# Patient Record
Sex: Female | Born: 1945 | Race: White | Hispanic: No | Marital: Single | Smoking: Never smoker
Health system: Southern US, Community
[De-identification: ages and names within clinical notes are randomized; demographics above are authoritative.]

## PROBLEM LIST (undated history)

## (undated) DIAGNOSIS — F32A Depression, unspecified: Secondary | ICD-10-CM

## (undated) DIAGNOSIS — I10 Essential (primary) hypertension: Secondary | ICD-10-CM

## (undated) DIAGNOSIS — E785 Hyperlipidemia, unspecified: Secondary | ICD-10-CM

## (undated) DIAGNOSIS — R7989 Other specified abnormal findings of blood chemistry: Secondary | ICD-10-CM

## (undated) DIAGNOSIS — F419 Anxiety disorder, unspecified: Secondary | ICD-10-CM

## (undated) DIAGNOSIS — N189 Chronic kidney disease, unspecified: Secondary | ICD-10-CM

## (undated) HISTORY — DX: Depression, unspecified: F32.A

## (undated) HISTORY — DX: Chronic kidney disease, unspecified: N18.9

## (undated) HISTORY — PX: TONSILLECTOMY: SUR1361

## (undated) HISTORY — DX: Anxiety disorder, unspecified: F41.9

## (undated) HISTORY — DX: Essential (primary) hypertension: I10

## (undated) HISTORY — DX: Hyperlipidemia, unspecified: E78.5

## (undated) HISTORY — DX: Other specified abnormal findings of blood chemistry: R79.89

## (undated) HISTORY — PX: LAPAROSCOPIC TOTAL HYSTERECTOMY: SUR800

## (undated) HISTORY — PX: CHOLECYSTECTOMY: SHX55

## (undated) NOTE — Progress Notes (Signed)
 Formatting of this note is different from the original. Images from the original note were not included.    Patient Name:  [name]  (MRN: [id number])    DOB: 27-Jun-1946     Primary Care Provider: Dollie Snide, MD   Primary Cardiologist: Belvie Lonni Lever, MD   Primary Electrophysiologist: none  Date of Service: 04/08/2022   Clinic Follow Up   Assessment/Plan:  1. Chest pain  - atypical, seems musculoskeletal.  Likely a consequence of all the coughing - not inclined to pursue ischemic evaluation at this time   2. SOB (shortness of breath)  - probably lingering consequence of COVID   - symptomatic treatment with decongestant, cough suppressant - check echo, proBNP   3. Coronary artery disease involving native coronary artery of native heart without angina pectoris  - no concerning anginal symptoms - reassuring SPECT 2021 - continue aggressive risk factor modification   4. Essential hypertension  - well controlled - continue current medications   5. Mixed hyperlipidemia  - she is not on statin for unclear reasons.  Previously on atorvastatin 10 in the past - will start rosuvastatin  5 mg daily, consider up titrating to 10 if tolerated    Subjective:  Chief Complaint Chief Complaint  Patient presents with  ? New Patient    W/cardiology   History of Present Illness [name] is a 7 y.o. year old female who presents today for acute ED follow-up.  She was last seen in the office by [facility] in 2021, and previously followed by Dr. Malachy  Her cardiac problems include CAD status post prior RCA PCI (her index angina equivalent was extreme shortness of breath, no chest pain).  Other medical problems include rheumatoid arthritis, chronic iron-deficiency anemia/AVMs of the large/small intestines, hypertension, dyslipidemia.   She began feeling bad around September 1st, with shortness of breath and cough.  Tested positive for COVID in the [facility].  They  prescribed ?a COVID medication? which he took at home.  Essentially she has not felt well since with continued cough and congestion and shortness of breath.  She sought care again in an urgent care in the interim, was treated with steroids.  With her continued symptoms, along with some midsternal chest discomfort, she sought care in the new [place] ED a few days ago.  Workup there unremarkable including clear chest x-ray, normal high sensitivity troponin, and unremarkable ECG.   Current symptoms include continued cough, pleuritic type chest pain, and shortness of breath.  Her chest pain is improving slowly.    Allergies Allergies  Allergen Reactions  ? Remicade [Infliximab] Other (See Comments)    LUPUS , sob  ? Chocolate Flavor [Flavoring Agent] Other (See Comments)    constipation  ? Penicillins     RASH, HIVES   Medications Current Outpatient Medications  Medication Sig Dispense Refill  ? amLODIPine  (NORVASC ) 5 MG tablet Take 1 tablet (5 mg total) by mouth daily. 90 tablet 0  ? aspirin (ECOTRIN) 81 MG EC tablet Take 81 mg by mouth daily.    ? metoprolol  succinate (TOPROL -XL) 25 MG 24 hr tablet Take 1 tablet (25 mg total) by mouth daily. 90 tablet 0  ? pantoprazole  (PROTONIX ) 40 MG tablet Take 1 tablet (40 mg total) by mouth daily. 90 tablet 0  ? sertraline  (ZOLOFT ) 50 MG tablet Take 50 mg by mouth daily.    ? benzonatate (TESSALON) 100 MG capsule Take 1 capsule (100 mg total) by mouth 3 (three) times  daily as needed for Cough for up to 10 days. 30 capsule 1  ? rosuvastatin  (CRESTOR ) 5 MG tablet Take 1 tablet (5 mg total) by mouth daily. 90 tablet 3   No current facility-administered medications for this visit.   Past Medical History Past Medical History:  Diagnosis Date  ? Anemia, chronic disease   ? AVM (arteriovenous malformation) of colon    cauterized in 2017 without any issues since  ? Blood transfusion without reported diagnosis   ? CAD (coronary artery disease)   ? Chronic  kidney disease, stage III (moderate) (HCC)   ? Depression    [facility] 2018  ? History of blood transfusion   ? Hyperlipidemia   ? Hypertension   ? Hypothyroidism   ? Obesity, Class III, BMI 40-49.9 (morbid obesity) (HCC)   ? OSA (obstructive sleep apnea)    compliant with CPAP  ? Osteoarthritis   ? Rheumatoid arthritis (HCC)    discontinued meds & rheumatology follow-up after having bad reaction to Remicade years ago  ? TIA (transient ischemic attack) 2019   pt reports garbled speech for about 15 seconds once, no residual effects   Surgical History Past Surgical History:  Procedure Laterality Date  ? CARDIAC CATHETERIZATION  2014   Patent stent, no intervention  ? CHOLECYSTECTOMY    ? COLONOSCOPY  2017   Dr. Doren  ? CORONARY ANGIOPLASTY WITH STENT PLACEMENT  2005   DES to RCA  ? ORIF PROXIMAL HUMERUS FRACTURE Left   ? PORTACATH PLACEMENT Right    Replaced 2017  ? TONSILLECTOMY    ? TOTAL ABDOMINAL HYSTERECTOMY W/ BILATERAL SALPINGOOPHORECTOMY  1980  ? UPPER GASTROINTESTINAL ENDOSCOPY  2017   Dr. Doren  ? VAGINAL PROLAPSE REPAIR  1990   [facility] or [facility]   Family History She family history includes Cancer - Other in her father; Depression in her brother, brother, and mother; Diabetes in her brother and mother; Hypertension in her brother and mother.   Social History  She  reports that she has never smoked. She has never used smokeless tobacco. She reports that she does not drink alcohol and does not use drugs.   Review of Systems General:  negative for fevers, chills, fatigue, night sweats or weight changes  Respiratory: negative for shortness of breath, cough, wheezing or hemoptysis  Cardiovascular:   as in HPI  Gastrointestinal:  negative for nausea, vomiting, heartburn, abdominal pain or melena  Musculoskeletal: negative for joint stiffness, joint swelling, muscle pain or weakness  Neurological: negative for seizures, tremors, sensory changes or weakness    Objective:  Physical Exam BP 118/62   Pulse 66   Ht 5' (1.524 m)   Wt 185 lb (83.9 kg)   SpO2 97%   BMI 36.13 kg/m   General Appearance:  Alert, cooperative, no acute distress  Neck: No lymphadenopathy or JVD  Lungs:   Good respiratory effort, Lungs are clear to auscultation ; sternum ternder to palptation  Heart:  regular rhythm, normal rate with normal s1, s2, no murmur, no rub or gallop   Abdomen:   Normoactive bowel sounds, soft, non-tender, no distention  Extremities: No cyanosis or edema   Pulses: 2+ and symmetric  Skin: Intact, no rashes or lesions  Neurologic: Grossly non-focal, AOx3   Data:  ECG 04/08/2022: NSR ,  LAD, NS T wave changes   Echo 10/2019  There is mild thickening of the aortic valve leaflets.  There is no aortic stenosis.  There is no aortic regurgitation.  The tricuspid valve appears structurally normal.  There is trivial tricuspid regurgitation.  Pulmonary artery systolic pressure estimated from the TR jet is 30 mmHg.  The left ventricle is normal in size and function with no regional wall  motion abnormalities.  The estimated left ventricular ejection fraction is 60-65%.  SPECT 10/2019 1. Normal myocardial perfusion study without evidence of inducible ischemia or prior infarction.  2. Normal left ventricular wall motion and segmental function.  3. The post stress ejection fraction is measured at 71%.  4. Resting hypertension.  5. Since prior study December 2016, there is no significant change.  Return in about 1 year (around 04/09/2023).  An After Visit Summary (AVS) was printed and given to the patient.  MYRTIS Lonni Lever, MD, [facility] (Cardiology) 04/08/2022 10:01 Electronically signed by Belvie JAYSON Lever, MD at 04/08/2022 10:11 AM EDT

---

## 2003-02-16 ENCOUNTER — Encounter: Admission: RE | Admit: 2003-02-16 | Discharge: 2003-02-16 | Payer: Self-pay | Admitting: Obstetrics and Gynecology

## 2003-02-16 ENCOUNTER — Encounter: Payer: Self-pay | Admitting: Obstetrics and Gynecology

## 2003-10-27 ENCOUNTER — Other Ambulatory Visit: Payer: Self-pay

## 2005-11-01 ENCOUNTER — Emergency Department: Payer: Self-pay | Admitting: Emergency Medicine

## 2006-11-02 ENCOUNTER — Emergency Department: Payer: Self-pay | Admitting: Emergency Medicine

## 2008-04-05 ENCOUNTER — Ambulatory Visit: Payer: Self-pay | Admitting: Family Medicine

## 2008-05-13 ENCOUNTER — Ambulatory Visit: Payer: Self-pay | Admitting: Family Medicine

## 2008-07-12 ENCOUNTER — Encounter: Payer: Self-pay | Admitting: Internal Medicine

## 2008-08-08 ENCOUNTER — Encounter: Payer: Self-pay | Admitting: Internal Medicine

## 2008-10-06 ENCOUNTER — Ambulatory Visit: Payer: Self-pay | Admitting: Internal Medicine

## 2008-10-06 ENCOUNTER — Inpatient Hospital Stay (HOSPITAL_COMMUNITY): Admission: EM | Admit: 2008-10-06 | Discharge: 2008-10-08 | Payer: Self-pay | Admitting: Emergency Medicine

## 2008-12-09 ENCOUNTER — Emergency Department (HOSPITAL_COMMUNITY): Admission: EM | Admit: 2008-12-09 | Discharge: 2008-12-09 | Payer: Self-pay | Admitting: Emergency Medicine

## 2009-06-23 IMAGING — CR DG FOREARM 2V*L*
1 series · 2 of 2 positions shown · non-contrast
Comparison: none

REASON FOR EXAM: pain with bending of left arm
COMMENTS:

PROCEDURE:     MDR - MDR FOREARM LEFT  - May 13, 2008 [DATE]
RESULT:     AP and lateral views of the LEFT forearm reveal no acute
fracture. Mineralization of the radius and ulna appears normal. The
overlying soft tissues are normal in appearance as well.

[Series 1: view not recorded · 0.17mm/px · 2 of 2 slices shown]
[im 1/2]
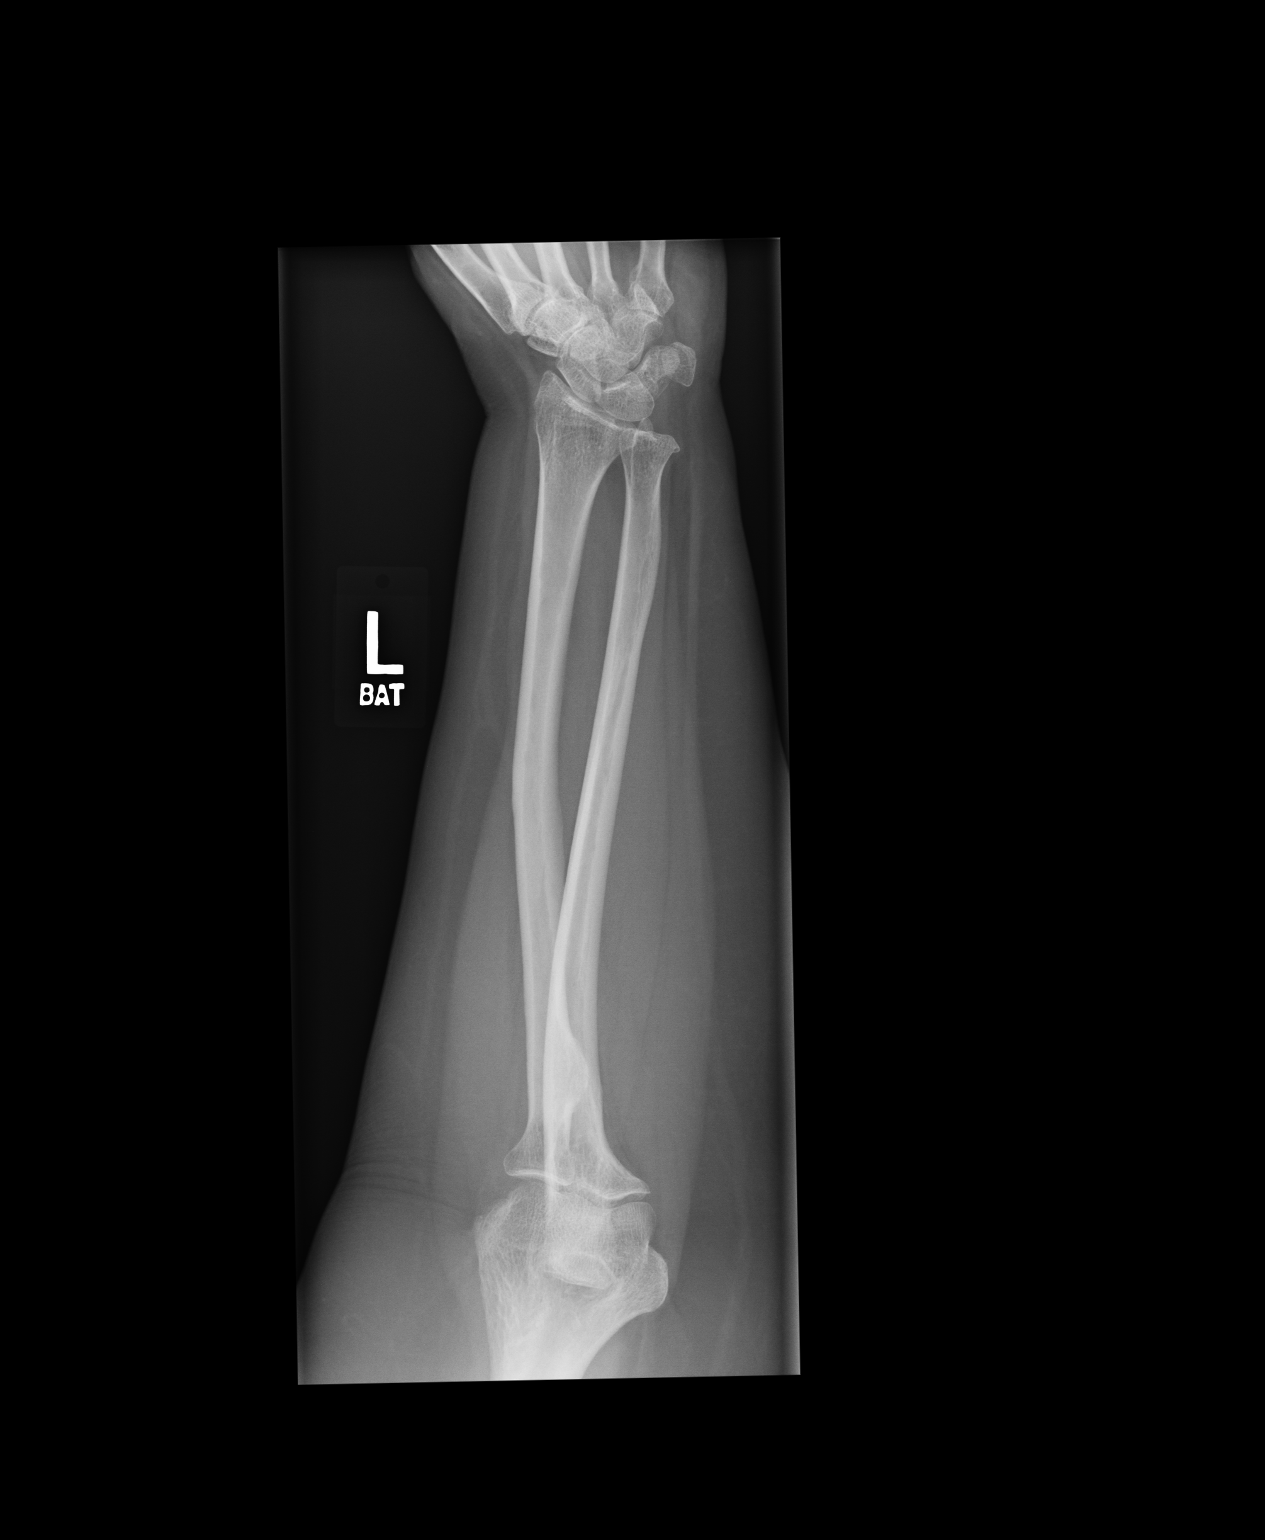
[im 2/2]
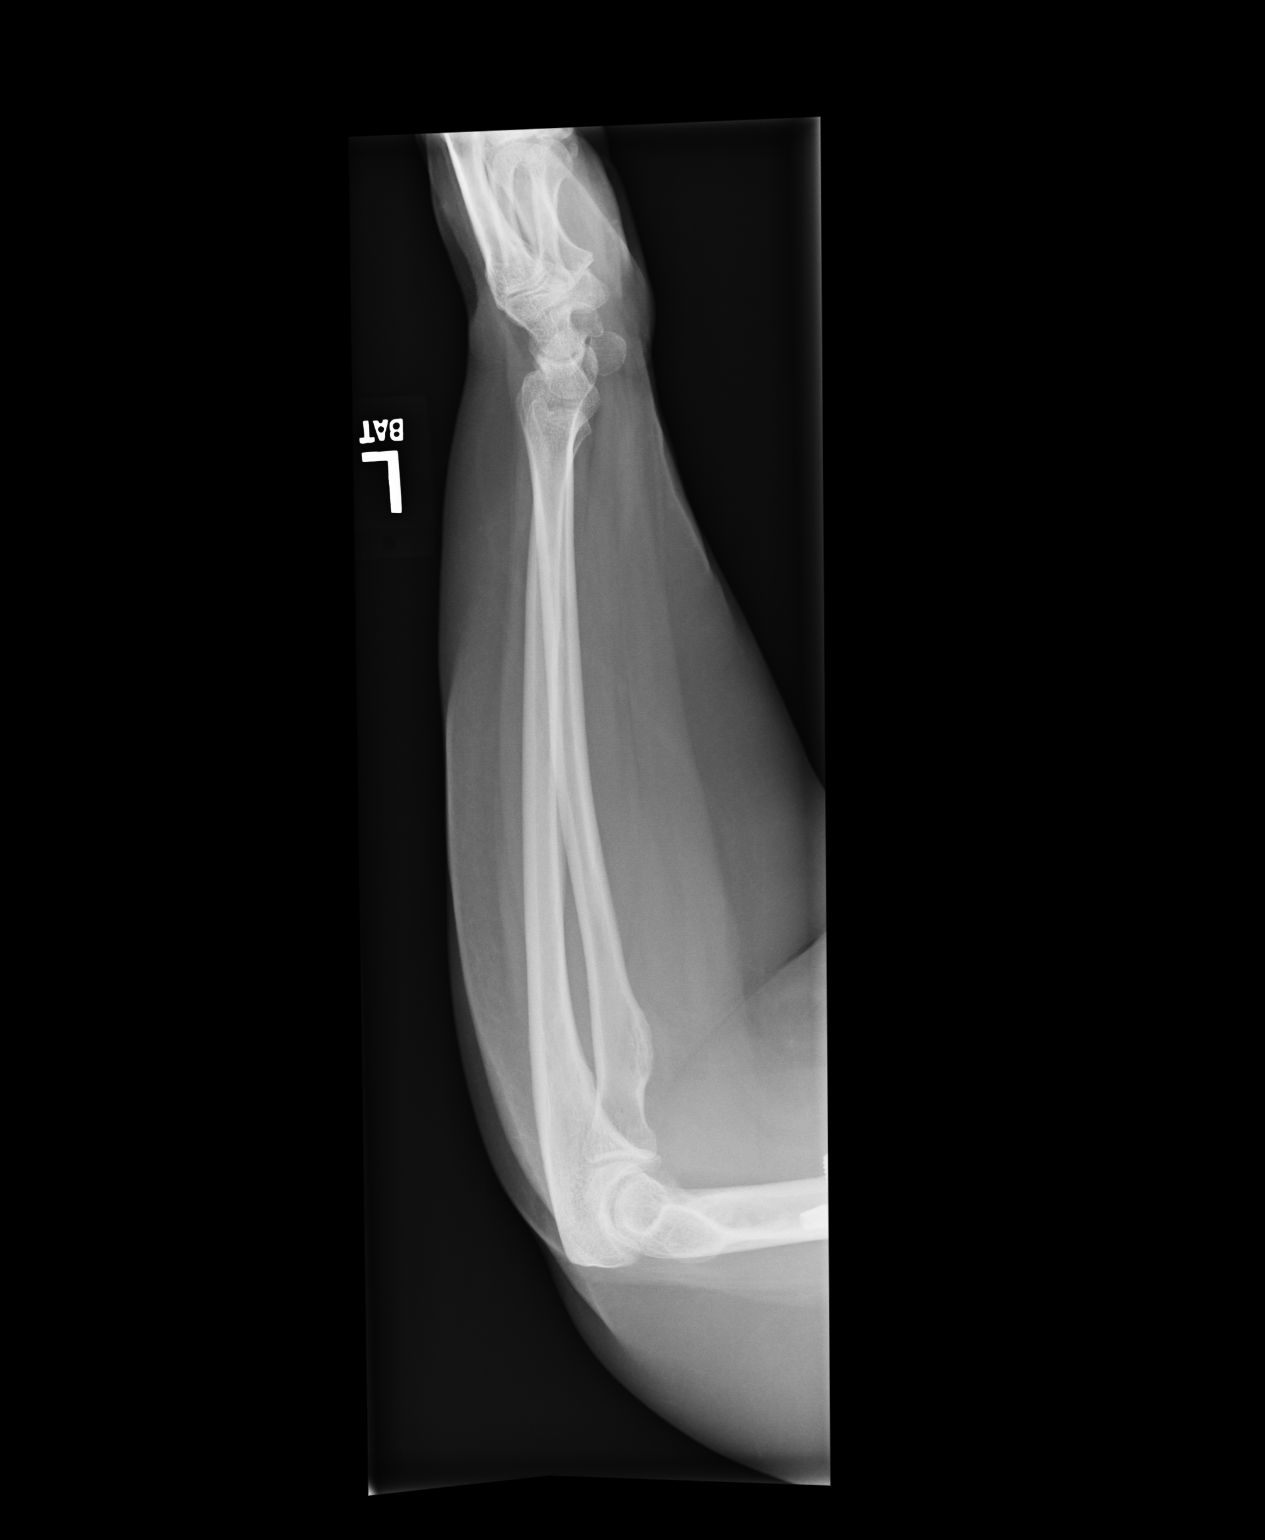

[2 of 2 positions shown; findings below may reference images not displayed]

IMPRESSION: I see no acute bony abnormality of the LEFT forearm.

## 2009-09-01 ENCOUNTER — Encounter: Payer: Self-pay | Admitting: Internal Medicine

## 2009-09-05 ENCOUNTER — Encounter: Payer: Self-pay | Admitting: Internal Medicine

## 2009-10-06 ENCOUNTER — Encounter: Payer: Self-pay | Admitting: Internal Medicine

## 2010-10-15 LAB — COMPREHENSIVE METABOLIC PANEL
ALT: 17 U/L (ref 0–35)
BUN: 23 mg/dL (ref 6–23)
Chloride: 104 mEq/L (ref 96–112)
Creatinine, Ser: 1.36 mg/dL — ABNORMAL HIGH (ref 0.4–1.2)
Glucose, Bld: 119 mg/dL — ABNORMAL HIGH (ref 70–99)
Sodium: 138 mEq/L (ref 135–145)
Total Protein: 6.4 g/dL (ref 6.0–8.3)

## 2010-10-15 LAB — DIFFERENTIAL
Basophils Absolute: 0 10*3/uL (ref 0.0–0.1)
Eosinophils Absolute: 0.1 10*3/uL (ref 0.0–0.7)
Eosinophils Relative: 1 % (ref 0–5)
Monocytes Absolute: 0.6 10*3/uL (ref 0.1–1.0)
Monocytes Relative: 4 % (ref 3–12)
Neutrophils Relative %: 83 % — ABNORMAL HIGH (ref 43–77)

## 2010-10-15 LAB — URINALYSIS, ROUTINE W REFLEX MICROSCOPIC
Ketones, ur: NEGATIVE mg/dL
Specific Gravity, Urine: 1.009 (ref 1.005–1.030)
Urobilinogen, UA: 0.2 mg/dL (ref 0.0–1.0)

## 2010-10-15 LAB — CBC
HCT: 25.1 % — ABNORMAL LOW (ref 36.0–46.0)
MCHC: 32 g/dL (ref 30.0–36.0)
RDW: 16 % — ABNORMAL HIGH (ref 11.5–15.5)

## 2010-10-15 LAB — TYPE AND SCREEN: Antibody Screen: NEGATIVE

## 2010-10-17 LAB — GLUCOSE, CAPILLARY
Glucose-Capillary: 175 mg/dL — ABNORMAL HIGH (ref 70–99)
Glucose-Capillary: 83 mg/dL (ref 70–99)
Glucose-Capillary: 93 mg/dL (ref 70–99)

## 2010-10-17 LAB — CBC
HCT: 25.4 % — ABNORMAL LOW (ref 36.0–46.0)
HCT: 26.3 % — ABNORMAL LOW (ref 36.0–46.0)
Hemoglobin: 8.7 g/dL — ABNORMAL LOW (ref 12.0–15.0)
Hemoglobin: 8.9 g/dL — ABNORMAL LOW (ref 12.0–15.0)
MCHC: 33.5 g/dL (ref 30.0–36.0)
MCV: 88.3 fL (ref 78.0–100.0)
MCV: 89.2 fL (ref 78.0–100.0)
MCV: 89.8 fL (ref 78.0–100.0)
Platelets: 320 10*3/uL (ref 150–400)
Platelets: 330 10*3/uL (ref 150–400)
Platelets: 376 10*3/uL (ref 150–400)
RBC: 2.94 MIL/uL — ABNORMAL LOW (ref 3.87–5.11)
RDW: 19.3 % — ABNORMAL HIGH (ref 11.5–15.5)
WBC: 16.3 10*3/uL — ABNORMAL HIGH (ref 4.0–10.5)
WBC: 9.3 10*3/uL (ref 4.0–10.5)

## 2010-10-17 LAB — FOLATE: Folate: 18.7 ng/mL

## 2010-10-17 LAB — URINALYSIS, ROUTINE W REFLEX MICROSCOPIC
Glucose, UA: NEGATIVE mg/dL
Ketones, ur: NEGATIVE mg/dL
Specific Gravity, Urine: 1.017 (ref 1.005–1.030)
pH: 5.5 (ref 5.0–8.0)

## 2010-10-17 LAB — DIFFERENTIAL
Basophils Absolute: 0 10*3/uL (ref 0.0–0.1)
Basophils Relative: 1 % (ref 0–1)
Eosinophils Absolute: 0.1 10*3/uL (ref 0.0–0.7)
Neutro Abs: 6.3 10*3/uL (ref 1.7–7.7)
Neutrophils Relative %: 72 % (ref 43–77)

## 2010-10-17 LAB — BASIC METABOLIC PANEL
BUN: 12 mg/dL (ref 6–23)
BUN: 14 mg/dL (ref 6–23)
BUN: 17 mg/dL (ref 6–23)
CO2: 24 mEq/L (ref 19–32)
Calcium: 9.6 mg/dL (ref 8.4–10.5)
Chloride: 107 mEq/L (ref 96–112)
Chloride: 108 mEq/L (ref 96–112)
Creatinine, Ser: 0.97 mg/dL (ref 0.4–1.2)
GFR calc Af Amer: 58 mL/min — ABNORMAL LOW (ref >60)
GFR calc Af Amer: >60 mL/min (ref >60)
GFR calc non Af Amer: 48 mL/min — ABNORMAL LOW (ref >60)
GFR calc non Af Amer: 50 mL/min — ABNORMAL LOW (ref >60)
Glucose, Bld: 118 mg/dL — ABNORMAL HIGH (ref 70–99)
Glucose, Bld: 178 mg/dL — ABNORMAL HIGH (ref 70–99)
Potassium: 3.8 mEq/L (ref 3.5–5.1)
Potassium: 4.2 mEq/L (ref 3.5–5.1)
Sodium: 139 mEq/L (ref 135–145)
Sodium: 140 mEq/L (ref 135–145)

## 2010-10-17 LAB — URINE CULTURE: Special Requests: POSITIVE

## 2010-10-17 LAB — RETICULOCYTES: Retic Count, Absolute: 89 10*3/uL (ref 19.0–186.0)

## 2010-10-17 LAB — URINE MICROSCOPIC-ADD ON

## 2010-10-17 LAB — IRON AND TIBC
Saturation Ratios: 15 % — ABNORMAL LOW (ref 20–55)
UIBC: 282 ug/dL

## 2010-10-17 LAB — HEMOGLOBIN A1C
Hgb A1c MFr Bld: 5.6 % (ref 4.6–6.1)
Mean Plasma Glucose: 114 mg/dL

## 2010-11-20 NOTE — H&P (Signed)
[name], [name]              ACCOUNT NO.:  [id number]   MEDICAL RECORD NO.:  [id number]          PATIENT TYPE:  INP   LOCATION:  5125                         FACILITY:  [facility]   PHYSICIAN:  Valerie A. Felicity Coyer, MDDATE OF BIRTH:  [birth year]   DATE OF ADMISSION:  10/06/2008  DATE OF DISCHARGE:                              HISTORY & PHYSICAL   PRIMARY CARE PHYSICIAN:  Dr. Willette Pa, general practitioner at [facility], unassigned to the [facility].   CHIEF COMPLAINT:  Weakness with tremor.   HISTORY OF PRESENT ILLNESS:  A 64 year old white female with multiple  chronic medical issues, who presents to the [facility] ED complaining of  weakness for greater than 24 hours.  She reports onset of these symptoms  after she treated herself last night with Nitrostat to her hemorrhoids  on advise of her surgeon following a visit with her surgeon yesterday  morning.  She has been advised by her primary care physician not to do  so due to adverse side effect of weakness, but the surgeon reported that  this was okay since her hemorrhoids cream was not working, however,  after the treatment, the patient felt so traumatically weak that she  began to shake all over her upper and lower extremities including her  head and neck.  There is no associated incontinence or loss of  consciousness.  No fever or chills.  No syncope or dizzy complaints.  She reports she has also recently on September 29, 2008, decreased her  prednisone from 5 to 4 mg daily in effort to wean off the prednisone due  to coexisting osteoporosis with history of fractures as well as  coexisting type 2 diabetes, but reports this always happens when I go  less than 5 mg per day.  The patient currently denies headache.  No  chest pain.  No shortness of breath.   PAST MEDICAL HISTORY:  Significant for:  1. Type 2 diabetes.  2. Rheumatoid arthritis, on chronic prednisone.  3. History of lupus.  4. Anemia of chronic disease,  status post Hematology eval at both [facility] and [facility] by her report with frequent history of      transfusions, but no specific diagnosis.  5. History of bilateral humerus fractures with skilled nursing      facility in October 2009 at [facility] for short-term rehab.   MEDICATIONS:  At this time, the patient is unsure of her medications  because EMS had the list and it is unavailable to me at this time.  She  does know that she was previously been maintained on prednisone 10 mg  daily and has been increasing this dose by 1 mg each month on the 25th  and has been on 4 mg daily since September 29, 2008.   ALLERGIES:  She has no known drug allergies.   FAMILY HISTORY:  Reviewed and noncontributory to current illness.   SOCIAL HISTORY:  She reports she lives alone, but has a supportive  daughter, who also recently broke her leg.  She does not smoke or  drink.  She is independent with her ADLs and walks independently without  assistance of a cane or walker.   REVIEW OF SYSTEMS:  See HPI above for pertinent positives.  NEUROLOGIC:  Positive for balance problems, positive for shaking all over in the last  24 hours similar to events such as occurred in 2008 when she was  diagnosed with lupus.  No evidence of headaches or mental status  changes.  RESPIRATORY:  No shortness of breath and no cough.  CARDIOVASCULAR:  No chest pain.  No lower extremity swelling changes  bilaterally and no weight changes and no hypoglycemic events.  GI: No  nausea, vomiting, or bowel movement changes.  No abdominal pain.  PSYCH:  Denies anxiety or depression.  MUSCULOSKELETAL:  Positive arthritis  including cramping of her hands and feet, especially at night.   PHYSICAL EXAMINATION:  VITAL SIGNS:  Temperature 99.1, blood pressure  159/55, pulse of 92, respirations 12, and sating 98% on room air.  GENERAL:  She is an obese, but pleasant and alert, older white woman, in  no acute distress.  NEUROLOGIC:   Cranial nerves II through XII are symmetric and intact.  She follows commands.  Her speech is fluent, but has an involuntary and  intermittent tremor throughout her entire body associated with  stuttering speech, alternating with periods of normalcy.  NECK:  Thick, but supple.  No mass.  No thyromegaly.  EYES:  PERRL with no icterus.  No nystagmus.  ENT:  Grossly normal hearing with normal external pinna bilaterally.  RESPIRATORY:  No increased work of breathing or use of accessory  muscles.  LUNGS:  Sounds are clear to auscultation bilaterally without wheeze,  crackle, or rhonchi, but decreased at the bases.  CARDIOVASCULAR:  Regular rate and rhythm with a soft 2/6 systolic  murmur.  There are no rubs or gallops appreciated.  EXTREMITIES:  1+ bilateral edema without pitting.  Good cap refill  bilaterally.  ABDOMEN:  Obese, soft, and nontender with good bowel sounds.  No  appreciable hepatosplenomegaly, hernias, or mass to palpation.  PSYCH:  She is awake, alert, and oriented x3 with a normal mood and  affect.   LABORATORY DATA:  White count normal at 8.8, hemoglobin depressed at  8.8, MCV of 90, and platelets normal at 330.  Basic metabolic; normal  electrolytes and creatinine, noting a glucose of 118.  UA positive for 7-  10 white cells and rare bacteria.  EKG shows sinus tach at 103, but no  ST-T changes.  No old EKG for comparison.   ASSESSMENT AND PLAN:  1. Weakness with failure to thrive and tremors, likely multifactorial      with recent ongoing steroid taper, last decreased approximately 1      week ago, also associated with urinary tract infection and anemia.      See details below.  We will admit to non-tele, increase steroids      for 24 hours for stress dose, and treat other issues as listed      below.  We will check TSH and plan for PT/OT to see while here      regarding rehab recs.  2. Positive urinalysis consistent with urinary tract infection.  We      will send  for urine culture.  Continue Rocephin as begun in the ED      while awaiting urine culture.  The patient afebrile and nontoxic.  3. Anemia, chronic disease by history, likely associated with her  immunologic disease such as rheumatoid or lupus.  Per patient no      answer from previous Hematology review at [facility] has      requested another opinion, but I have deferred this to her primary      care physician to arrange outpatient followup following discharge.      We will plan to recheck CBC in the a.m. along with anemia panel and      plan transfusion if hemoglobin less than 8.  No evidence of acute      bleeding or blood loss.  4. Rheumatoid arthritis with chronic prednisone use.  Noted efforts to      taper by patient and PCP due to comorbidities from chronic steroid      use.  She has no evidence of adrenal crisis.  She is      hemodynamically stable.  Her lytes are normal.  See weakness with      failure to thrive and tremors above regarding 24 hour plans for      stress hydrocortisone and then resume prednisone at a dose of 7.5      mg daily until further followup with Rheumatology and PCP following      discharge.  5. Involuntary muscle twitching.  See weakness with failure to thrive      and tremors as well as her bilateral upper and lower extremities,      but did not associated with loss of consciousness.  Question      myoclonic versus anxiety or other psych components, not typical      seizure pattern on my observation.  We will check TSH, await PT/OT      eval.  Again, lytes and creatinine okay, afebrile and nonfocal or      localizing eval on my neuro exam.  6. Hypertension.  Question home meds.  We will check with Pharmacy to      assist with home medicine reconciliation as we are missing her home      list and plan home treatment once known.  7. Diabetes.  Again unknown home med treatment.  Plan sliding scale      insulin, discuss it while on stress steroids  and resume home      medicines once known.   It has been a pleasure meeting and caring for Ms. Rudie Meyer.  Please see orders for further details.      Valerie A. Felicity Coyer, MD  Electronically Signed     VAL/MEDQ  D:  10/06/2008  T:  10/07/2008  Job:  [id number]

## 2010-11-20 NOTE — Discharge Summary (Signed)
[name], [name] NO.:  [id number]   MEDICAL RECORD NO.:  [id number]          PATIENT TYPE:  INP   LOCATION:  5125                         FACILITY:  [facility]   PHYSICIAN:  Beckey Rutter, MD  DATE OF BIRTH:  [birth year]   DATE OF ADMISSION:  10/06/2008  DATE OF DISCHARGE:  10/08/2008                               DISCHARGE SUMMARY   PRIMARY CARE PHYSICIAN:  Dr. Willette Pa, general practitioner at [facility].   The patient is unassigned to [facility].   CHIEF COMPLAINT:  Weakness and tremor.   HISTORY OF PRESENT ILLNESS:  Briefly, 65 year old with past medical  history of drug-induced lupus and rheumatoid arthritis, presented to the  emergency room with above complaint.  The patient was found to have  urinary tract infection as well as tremor and weakness, which was felt  secondary to adrenal crisis.  The patient was taking 4 mg of prednisone  and she was in a long course of tapering of the steroid, secondary to  osteoporosis.  During the hospital stay, the patient was started on  intravenous steroid, which has reversed her tremor and weakness quickly.  The patient was started on antibiotic Rocephin for the urinary tract  infection for which the culture is pending.   Unfortunately the cortisone level was not obtained prior to the  intravenous steroid.  Currently, the patient is stable for discharge.  I  will continue the patient on antibiotic Amoxil/amoxicillin 500 mg p.o.  b.i.d. for 3 more days.  I will also continue the patient on prednisone  20 mg for 1 more day and then 10 mg daily.  I will be very careful not  to start the patient back on steroid, nevertheless at this time the  patient would need the steroid for the reason as discussed above.   Diabetes.  This is felt secondary to steroids.  Her A1c is 5.6.  Her  CBGs are acceptable while she is in the hospital.  I discussed with the  patient the fact that she might not have diabetes and this is  all just  steroid-induced hyperglycemia.   DISCHARGE MEDICATIONS:  1. Lisinopril 40 mg daily.  2. Simvastatin 40 mg at bedtime.  3. Effexor XR 150 mg daily.  4. Toprol-XL 50 mg daily.  5. Aspirin enteric coated 81 mg daily.  6. MiraLax 17 g daily p.r.n.  7. Centrum Silver daily.  8. Tylenol extended release 500 mg every 4-6 hours p.r.n.  9. Hydrochlorothiazide 25 mg daily.  10.Anusol 2.5% apply topically to the hemorrhoid area q.i.d.  11.Prednisone 20 mg tomorrow and 10 mg daily.   DISCHARGE DIAGNOSES:  1. Tremor and generalized weakness felt secondary to adrenal crisis.  2. Urinary tract infection.  3. Rheumatoid arthritis, on chronic prednisone.  4. Steroid-induced hyperglycemia.  5. Anemia of chronic disease.  6. Bilateral humerus fracture.  7. Osteopenia versus osteoporosis per history.   DISCHARGE PLAN:  The patient will be discharged with above medication.  I had a lengthy discussion with her in regards to the steroid, and I  recommended and advised her to follow  up with her primary physician as  soon as possible.  Prescription for Anusol, prednisone, and amoxicillin  was prescribed.  She is aware and agreeable to discharge plan.      Beckey Rutter, MD  Electronically Signed     EME/MEDQ  D:  10/08/2008  T:  10/09/2008  Job:  [id number]

## 2022-11-18 ENCOUNTER — Ambulatory Visit (INDEPENDENT_AMBULATORY_CARE_PROVIDER_SITE_OTHER): Payer: Medicare PPO | Admitting: Family Medicine

## 2022-11-18 ENCOUNTER — Encounter: Payer: Self-pay | Admitting: Family Medicine

## 2022-11-18 VITALS — BP 148/80 | HR 72 | Temp 98.4°F | Ht 60.0 in | Wt 199.5 lb

## 2022-11-18 DIAGNOSIS — E785 Hyperlipidemia, unspecified: Secondary | ICD-10-CM

## 2022-11-18 DIAGNOSIS — F419 Anxiety disorder, unspecified: Secondary | ICD-10-CM

## 2022-11-18 DIAGNOSIS — I10 Essential (primary) hypertension: Secondary | ICD-10-CM | POA: Diagnosis not present

## 2022-11-18 DIAGNOSIS — F32A Depression, unspecified: Secondary | ICD-10-CM | POA: Diagnosis not present

## 2022-11-18 MED ORDER — METOPROLOL SUCCINATE ER 25 MG PO TB24
25.0000 mg | ORAL_TABLET | Freq: Every day | ORAL | 0 refills | Status: AC
Start: 2022-11-18 — End: ?

## 2022-11-18 MED ORDER — AMLODIPINE BESYLATE 5 MG PO TABS
5.0000 mg | ORAL_TABLET | Freq: Every day | ORAL | 0 refills | Status: AC
Start: 2022-11-18 — End: ?

## 2022-11-18 MED ORDER — ROSUVASTATIN CALCIUM 5 MG PO TABS
5.0000 mg | ORAL_TABLET | Freq: Every day | ORAL | 0 refills | Status: AC
Start: 2022-11-18 — End: ?

## 2022-11-18 MED ORDER — SERTRALINE HCL 50 MG PO TABS
50.0000 mg | ORAL_TABLET | Freq: Every day | ORAL | 0 refills | Status: AC
Start: 2022-11-18 — End: ?

## 2022-11-18 NOTE — Progress Notes (Signed)
New Patient Office Visit  Subjective    Patient ID: [name], female    DOB: [birth year]  Age: 77 y.o. MRN: [id number]  CC:  Chief Complaint  Patient presents with   Establish Care    Relocating from [place]. She is requesting medication refills.    Hypertension   Hyperlipidemia    HPI [name] presents to establish care with this practice. She is new to me. She has moved to [place] from [place]. Has a daughter who lives in [place] too. She  has not been able to find PCP and reports she has been "conserving" her medications.   HTN: amlodipine and metoprolol, has not been taking daily, taking 3-4 times per week to conserve meds until she was able to find PCP. Blood pressure not at goal today. She will start taking meds daily and will assess effectiveness in 4 weeks. Denies chest pain, shortness of breath, lower extremity swelling, vision changes, and headaches.   HLD: on Crestor 2-3 times per week. Will get labs at annual wellness visit in 4 weeks. Tolerating medication well, no side effects reported.   Depression and anxiety: sertraline 50 mg at least 2- 3 times per week. Symptoms are well controlled. Denies thoughts of self-harm.         Outpatient Encounter Medications as of 11/18/2022  Medication Sig   aspirin EC 81 MG tablet Take 81 mg by mouth.   pantoprazole (PROTONIX) 40 MG tablet Take 40 mg by mouth.   prednisoLONE acetate (PRED FORTE) 1 % ophthalmic suspension Use as directed on post op instruction sheet from Dr. Loraine Grip.   [DISCONTINUED] amLODipine (NORVASC) 5 MG tablet Take 5 mg by mouth daily.   [DISCONTINUED] metoprolol succinate (TOPROL-XL) 25 MG 24 hr tablet Take 25 mg by mouth daily.   [DISCONTINUED] rosuvastatin (CRESTOR) 5 MG tablet Take 5 mg by mouth.   [DISCONTINUED] sertraline (ZOLOFT) 50 MG tablet Take 50 mg by mouth.   amLODipine (NORVASC) 5 MG tablet Take 1 tablet (5 mg total) by mouth daily.   azithromycin (ZITHROMAX) 250  MG tablet Take 250 mg by mouth. (Patient not taking: Reported on 11/18/2022)   doxycycline (VIBRA-TABS) 100 MG tablet Take 100 mg by mouth 2 (two) times daily. (Patient not taking: Reported on 11/18/2022)   fluticasone (FLONASE) 50 MCG/ACT nasal spray Place 1 spray into both nostrils 2 (two) times daily. (Patient not taking: Reported on 11/18/2022)   hydrocortisone (ANUSOL-HC) 2.5 % rectal cream Place 1 Application rectally. (Patient not taking: Reported on 11/18/2022)   meclizine (ANTIVERT) 25 MG tablet 25 mg. (Patient not taking: Reported on 11/18/2022)   metoprolol succinate (TOPROL-XL) 25 MG 24 hr tablet Take 1 tablet (25 mg total) by mouth daily.   moxifloxacin (VIGAMOX) 0.5 % ophthalmic solution Place one drop into the right eye 4 (four) times daily for 7 days. Use as directed on post op instruction sheet from Dr. Loraine Grip.  Dispense remainder home. (Patient not taking: Reported on 11/18/2022)   mupirocin ointment (BACTROBAN) 2 % daily as needed. (Patient not taking: Reported on 11/18/2022)   promethazine-dextromethorphan (PROMETHAZINE-DM) 6.25-15 MG/5ML syrup Take 5 mLs by mouth 4 (four) times daily as needed for cough. (Patient not taking: Reported on 11/18/2022)   rosuvastatin (CRESTOR) 5 MG tablet Take 1 tablet (5 mg total) by mouth daily.   sertraline (ZOLOFT) 50 MG tablet Take 1 tablet (50 mg total) by mouth daily.   triamcinolone (KENALOG) 0.025 % cream daily as needed. (Patient not taking: Reported on  11/18/2022)   No facility-administered encounter medications on file as of 11/18/2022.    Past Medical History:  Diagnosis Date   Anxiety and depression    CKD (chronic kidney disease)    HLD (hyperlipidemia)    Hypertension     Past Surgical History:  Procedure Laterality Date   CHOLECYSTECTOMY     LAPAROSCOPIC TOTAL HYSTERECTOMY     TONSILLECTOMY      Family History  Problem Relation Age of Onset   Lung disease Mother    Mental illness Father     Social History   Socioeconomic  History   Marital status: Divorced    Spouse name: Not on file   Number of children: 1   Years of education: Not on file   Highest education level: Not on file  Occupational History   Not on file  Tobacco Use   Smoking status: Never   Smokeless tobacco: Never  Substance and Sexual Activity   Alcohol use: Not Currently   Drug use: Never   Sexual activity: Not Currently  Other Topics Concern   Not on file  Social History Narrative   Not on file   Social Determinants of Health   Financial Resource Strain: Not on file  Food Insecurity: Not on file  Transportation Needs: Not on file  Physical Activity: Not on file  Stress: Not on file  Social Connections: Not on file  Intimate Partner Violence: Not on file    Review of Systems  Constitutional:  Negative for chills and fever.  Eyes:  Negative for blurred vision and double vision.  Respiratory:  Negative for shortness of breath.   Cardiovascular:  Negative for chest pain.  Gastrointestinal:  Negative for abdominal pain, nausea and vomiting.  Neurological:  Negative for dizziness and headaches.  Psychiatric/Behavioral:  Negative for depression and suicidal ideas.        Objective    BP (!) 148/80   Pulse 72   Temp 98.4 F (36.9 C) (Oral)   Ht 5' (1.524 m)   Wt 199 lb 8 oz (90.5 kg)   SpO2 97%   BMI 38.96 kg/m   Physical Exam Vitals and nursing note reviewed.  Constitutional:      Appearance: Normal appearance. She is obese.  Cardiovascular:     Rate and Rhythm: Regular rhythm.     Heart sounds: Normal heart sounds.  Pulmonary:     Effort: Pulmonary effort is normal.     Breath sounds: Normal breath sounds.  Skin:    General: Skin is warm and dry.     Capillary Refill: Capillary refill takes less than 2 seconds.  Neurological:     General: No focal deficit present.     Mental Status: She is alert. Mental status is at baseline.  Psychiatric:        Mood and Affect: Mood normal.        Behavior: Behavior  normal.        Thought Content: Thought content normal.        Judgment: Judgment normal.       Assessment & Plan:   Problem List Items Addressed This Visit     Essential hypertension - Primary  Not at goal. Has not been taking amlodipine and metoprolol daily as prescribed. She has been conserving medications to avoid running out. Discussed lifestyle modifications at length. DASH diet. Refills sent today. She will continue with amlodipine 5 mg and metoprolol XL 25 mg daily as prescribed. Follow-up in 4  weeks for annual wellness, will assess BP at that visit. Denies chest pain, shortness of breath, lower extremity edema, vision changes, and headaches. Refills sent.    Relevant Medications   aspirin EC 81 MG tablet   amLODipine (NORVASC) 5 MG tablet   rosuvastatin (CRESTOR) 5 MG tablet   metoprolol succinate (TOPROL-XL) 25 MG 24 hr tablet   Hyperlipidemia  Will get lipid panel at wellness visit. Continue rosuvastatin 5 mg daily. Heart healthy diet and exercise recommended. Refill sent.    Relevant Medications   aspirin EC 81 MG tablet   amLODipine (NORVASC) 5 MG tablet   rosuvastatin (CRESTOR) 5 MG tablet   metoprolol succinate (TOPROL-XL) 25 MG 24 hr tablet   Anxiety and depression  Symptoms are well controlled. Continue with sertraline 50 mg daily as prescribed. Denies thoughts of self-harm. Will continue to monitor. Refill sent.    Relevant Medications   sertraline (ZOLOFT) 50 MG tablet  Agrees with plan of care discussed.  Questions answered.   Return in about 4 weeks (around 12/16/2022) for medicare wellness with labs .   Novella Olive, FNP

## 2022-11-18 NOTE — Patient Instructions (Signed)
Healthy Heart:  Recommend heart healthy/Mediterranean diet, with whole grains, fruits, vegetable, fish, lean meats, nuts, and olive oil. Limit salt. Recommend moderate walking, 3-5 times/week for 30-50 minutes each session. Aim for at least 150 minutes.week. Goal should be pace of 3 miles/hours, or walking 1.5 miles in 30 minutes Recommend avoidance of tobacco products. Avoid excess alcohol.  

## 2022-12-16 ENCOUNTER — Encounter: Payer: Self-pay | Admitting: Family Medicine

## 2022-12-16 ENCOUNTER — Ambulatory Visit (INDEPENDENT_AMBULATORY_CARE_PROVIDER_SITE_OTHER): Payer: Medicare PPO | Admitting: Family Medicine

## 2022-12-16 VITALS — BP 124/74 | HR 64 | Temp 98.5°F | Ht 60.0 in | Wt 196.6 lb

## 2022-12-16 DIAGNOSIS — R5383 Other fatigue: Secondary | ICD-10-CM

## 2022-12-16 DIAGNOSIS — Z0000 Encounter for general adult medical examination without abnormal findings: Secondary | ICD-10-CM

## 2022-12-16 DIAGNOSIS — Z23 Encounter for immunization: Secondary | ICD-10-CM | POA: Diagnosis not present

## 2022-12-16 DIAGNOSIS — Z87898 Personal history of other specified conditions: Secondary | ICD-10-CM | POA: Insufficient documentation

## 2022-12-16 DIAGNOSIS — R7303 Prediabetes: Secondary | ICD-10-CM

## 2022-12-16 DIAGNOSIS — K219 Gastro-esophageal reflux disease without esophagitis: Secondary | ICD-10-CM

## 2022-12-16 DIAGNOSIS — I10 Essential (primary) hypertension: Secondary | ICD-10-CM

## 2022-12-16 DIAGNOSIS — E785 Hyperlipidemia, unspecified: Secondary | ICD-10-CM

## 2022-12-16 MED ORDER — PANTOPRAZOLE SODIUM 40 MG PO TBEC
40.0000 mg | DELAYED_RELEASE_TABLET | Freq: Every day | ORAL | 1 refills | Status: AC
Start: 2022-12-16 — End: ?

## 2022-12-16 NOTE — Patient Instructions (Signed)
Return when you start to have the cough and wheezing for evaluation.

## 2022-12-16 NOTE — Progress Notes (Signed)
Annual Wellness Visit      Patient: [name], Female    DOB: 12/10/1945, 77 y.o.   MRN: [id number] Visit Date: 12/16/2022  Today's Provider: Novella Olive, FNP  Subjective:    Chief Complaint  Patient presents with   Annual Exam    Annual wellness visit-Pt is requesting refill on Protonix-90 day. She is fasting.    [name] is a 77 y.o. female who presents today for her Annual Wellness Visit.   HPI She reports that she has fatigue. Sleeps 12-15 hours on some nights. Reports she has been diagnosed with sleep apnea in the past. Does not use C-pap, chronic fatigue. Will place referral for sleep study.  History of vertigo. Will place referral for Physical Therapy to prevent future falls.   Reports dry cough chronically, not present currently. Denies history of smoking. Describes as chest tightness and wheezing. She will return to clinic when symptoms are present for further evaluation.   GERD: needs refill today for PPI.      Medications: Outpatient Medications Prior to Visit  Medication Sig   amLODipine (NORVASC) 5 MG tablet Take 1 tablet (5 mg total) by mouth daily.   aspirin EC 81 MG tablet Take 81 mg by mouth.   metoprolol succinate (TOPROL-XL) 25 MG 24 hr tablet Take 1 tablet (25 mg total) by mouth daily.   prednisoLONE acetate (PRED FORTE) 1 % ophthalmic suspension Use as directed on post op instruction sheet from Dr. Loraine Grip.   rosuvastatin (CRESTOR) 5 MG tablet Take 1 tablet (5 mg total) by mouth daily.   sertraline (ZOLOFT) 50 MG tablet Take 1 tablet (50 mg total) by mouth daily.   [DISCONTINUED] pantoprazole (PROTONIX) 40 MG tablet Take 40 mg by mouth.   promethazine-dextromethorphan (PROMETHAZINE-DM) 6.25-15 MG/5ML syrup Take 5 mLs by mouth 4 (four) times daily as needed for cough. (Patient not taking: Reported on 11/18/2022)   [DISCONTINUED] azithromycin (ZITHROMAX) 250 MG tablet Take 250 mg by mouth. (Patient not taking: Reported on 11/18/2022)    [DISCONTINUED] doxycycline (VIBRA-TABS) 100 MG tablet Take 100 mg by mouth 2 (two) times daily. (Patient not taking: Reported on 11/18/2022)   [DISCONTINUED] fluticasone (FLONASE) 50 MCG/ACT nasal spray Place 1 spray into both nostrils 2 (two) times daily. (Patient not taking: Reported on 11/18/2022)   [DISCONTINUED] hydrocortisone (ANUSOL-HC) 2.5 % rectal cream Place 1 Application rectally. (Patient not taking: Reported on 11/18/2022)   [DISCONTINUED] meclizine (ANTIVERT) 25 MG tablet 25 mg. (Patient not taking: Reported on 11/18/2022)   [DISCONTINUED] moxifloxacin (VIGAMOX) 0.5 % ophthalmic solution Place one drop into the right eye 4 (four) times daily for 7 days. Use as directed on post op instruction sheet from Dr. Loraine Grip.  Dispense remainder home. (Patient not taking: Reported on 11/18/2022)   [DISCONTINUED] mupirocin ointment (BACTROBAN) 2 % daily as needed. (Patient not taking: Reported on 11/18/2022)   [DISCONTINUED] triamcinolone (KENALOG) 0.025 % cream daily as needed. (Patient not taking: Reported on 11/18/2022)   No facility-administered medications prior to visit.    Allergies  Allergen Reactions   Infliximab Other (See Comments)    Reports causing lupus  LUPUS , sob   Flavoring Agent Other (See Comments)    CHOCOLATE constipation  constipation   Penicillins Rash and Hives    RASH, HIVES    Patient Care Team: Novella Olive, FNP as PCP - General (Family Medicine)  Review of Systems  Constitutional:  Positive for chills and fatigue. Negative for activity change and fever.  Eyes:  Negative for pain and visual disturbance.  Respiratory:  Negative for chest tightness and shortness of breath.   Cardiovascular:  Negative for chest pain.  Gastrointestinal:  Negative for abdominal pain and blood in stool.  Endocrine: Positive for cold intolerance and heat intolerance.  Musculoskeletal:  Positive for arthralgias (all joints). Negative for gait problem and joint swelling.       Right  knee and hip pain  Skin:  Negative for rash and wound.  Neurological:  Negative for dizziness (reports vertigo with position changes), facial asymmetry and speech difficulty.  Psychiatric/Behavioral:  The patient is nervous/anxious.        Objective:    Vitals: BP 124/74   Pulse 64   Temp 98.5 F (36.9 C) (Oral)   Ht 5' (1.524 m)   Wt 196 lb 9.6 oz (89.2 kg)   SpO2 97%   BMI 38.40 kg/m    Physical Exam Vitals and nursing note reviewed.  Constitutional:      General: She is not in acute distress.    Appearance: Normal appearance. She is obese.  HENT:     Right Ear: Tympanic membrane normal.     Left Ear: Tympanic membrane normal.     Nose: Nose normal.     Mouth/Throat:     Mouth: Mucous membranes are moist.     Pharynx: Oropharynx is clear.  Eyes:     Extraocular Movements: Extraocular movements intact.  Neck:     Thyroid: No thyroid tenderness.  Cardiovascular:     Rate and Rhythm: Normal rate and regular rhythm.     Pulses: Normal pulses.     Heart sounds: Normal heart sounds.  Pulmonary:     Effort: Pulmonary effort is normal.     Breath sounds: Normal breath sounds.  Abdominal:     General: Bowel sounds are normal.     Palpations: Abdomen is soft.     Tenderness: There is no abdominal tenderness.  Musculoskeletal:        General: Normal range of motion.     Right lower leg: No edema.     Left lower leg: No edema.  Lymphadenopathy:     Cervical:     Right cervical: No superficial cervical adenopathy.    Left cervical: No superficial cervical adenopathy.  Skin:    General: Skin is warm and dry.     Capillary Refill: Capillary refill takes less than 2 seconds.  Neurological:     General: No focal deficit present.     Mental Status: Mental status is at baseline.  Psychiatric:        Mood and Affect: Mood normal.        Behavior: Behavior normal.        Thought Content: Thought content normal.        Judgment: Judgment normal.     Most recent  functional status assessment:    12/16/2022    8:30 AM  In your present state of health, do you have any difficulty performing the following activities:  Hearing? 0    Most recent fall risk assessment:    11/18/2022    1:52 PM  Fall Risk   Falls in the past year? 0  Number falls in past yr: 0  Injury with Fall? 0  Risk for fall due to : No Fall Risks  Follow up Falls evaluation completed     Most recent depression screenings:    11/18/2022    1:52 PM  PHQ  2/9 Scores  PHQ - 2 Score 0  PHQ- 9 Score 2    Most recent cognitive screening:     No data to display        Functional status. PHQ-9: 2 (two weeks ago).  Falls. Mini-Cog: 5, correct clock drawing and recall of 3 words x 2.  Opioid risk. Does not take any controlled substances.   No results found for any visits on 12/16/22.     Assessment & Plan:      Annual wellness visit done today including the all of the following: Reviewed patient's Family Medical History Reviewed and updated list of patient's medical providers Assessment of cognitive impairment was done Assessed patient's functional ability. Referral to PT for history of vertigo with remote fall resulting in bone fracture.  Established a written schedule for health screening services Health Risk Assessent Completed and Reviewed  Exercise Activities and Dietary recommendations  Goals   Continue to walk several times per week. Go to PT for vertigo. Go to pharmacy for pneumonia vaccine. Tdap today.      Immunization History  Administered Date(s) Administered   Fluad Quad(high Dose 65+) 05/21/2021   H1N1 06/15/2008   Influenza, High Dose Seasonal PF 05/12/2014, 03/14/2015, 05/20/2016, 04/09/2017   Influenza, Quadrivalent, Recombinant, Inj, Pf 08/03/2018   Influenza, Seasonal, Injecte, Preservative Fre 04/04/2006, 05/21/2007, 05/03/2009, 05/01/2010, 04/16/2011   Influenza,inj,quad, With Preservative 02/18/2022   Moderna Covid-19 Vaccine Bivalent  Booster 54yrs & up 03/26/2021   Moderna Sars-Covid-2 Vaccination 08/26/2019, 09/15/2019, 05/28/2020   PFIZER Comirnaty(Gray Top)Covid-19 Tri-Sucrose Vaccine 11/02/2020   PFIZER SARS-COV-2 Pediatric Vaccination 5-16yrs 08/26/2019, 09/15/2019, 04/01/2020, 11/02/2020, 03/26/2021   PFIZER(Purple Top)SARS-COV-2 Vaccination 08/26/2019, 09/15/2019, 04/01/2020   Pneumococcal Conjugate-13 11/16/2015   Pneumococcal Polysaccharide-23 02/21/2005, 04/08/2010, 08/05/2017, 05/08/2020, 02/18/2022   Pneumococcal-Unspecified 10/07/2008   Tdap 07/24/2010, 12/16/2022   Zoster Recombinat (Shingrix) 04/14/2018, 04/17/2018   Zoster, Live 07/08/2012, 11/16/2013    Health Maintenance  Topic Date Due   Zoster Vaccines- Shingrix (2 of 2) 02/18/2023 (Originally 06/12/2018)   INFLUENZA VACCINE  02/06/2023   Medicare Annual Wellness (AWV)  12/16/2023   DTaP/Tdap/Td (3 - Td or Tdap) 12/15/2032   Pneumonia Vaccine 63+ Years old  Completed   DEXA SCAN  Completed   Hepatitis C Screening  Completed   HPV VACCINES  Aged Out   COVID-19 Vaccine  Discontinued     Discussed health benefits of physical activity, and encouraged her to engage in regular exercise appropriate for her age and condition.   Has not seen specialist since moving.    Routine labs ordered due to chronic  conditions.  HCM reviewed/discussed. No longer requires pap smear. Needs Tdap today, will go to pharmacy for pneumonia vaccine. Anticipatory guidance regarding healthy weight, lifestyle and choices given. Recommend healthy diet.  Recommend approximately 150 minutes/week of moderate intensity exercise. Resistance training is good for building muscles and for bone health. Muscle mass helps to increase our metabolism and to burn more calories at rest.  Limit alcohol consumption: no more than one drink per day for women and 2 drinks per day for me. Recommend regular dental and vision exams. Always use seatbelt/lap and shoulder restraints. Recommend  using smoke alarms and checking batteries at least twice a year. Recommend using sunscreen when outside.  Please know that I am here to help you with all of your health care goals and am happy to work with you to find a solution that works best for you.  The greatest advice I have received with any  changes in life are to take it one step at a time, that even means if all you can focus on is the next 60 seconds, then do that and celebrate your victories.  With any changes in life, you will have set backs, and that is OK. The important thing to remember is, if you have a set back, it is not a failure, it is an opportunity to try again!  Agrees with plan of care discussed.  Questions answered.     Novella Olive, FNP  [facility] at [facility] [phone] (phone) [phone] (fax)  [facility]

## 2022-12-17 LAB — COMPREHENSIVE METABOLIC PANEL
ALT: 11 IU/L (ref 0–32)
AST: 19 IU/L (ref 0–40)
Albumin/Globulin Ratio: 1.6
Albumin: 4.1 g/dL (ref 3.8–4.8)
Alkaline Phosphatase: 99 IU/L (ref 44–121)
BUN/Creatinine Ratio: 15 (ref 12–28)
BUN: 15 mg/dL (ref 8–27)
Bilirubin Total: 0.4 mg/dL (ref 0.0–1.2)
CO2: 21 mmol/L (ref 20–29)
Calcium: 9.7 mg/dL (ref 8.7–10.3)
Chloride: 104 mmol/L (ref 96–106)
Creatinine, Ser: 1.03 mg/dL — ABNORMAL HIGH (ref 0.57–1.00)
Globulin, Total: 2.5 g/dL (ref 1.5–4.5)
Glucose: 94 mg/dL (ref 70–99)
Potassium: 4.2 mmol/L (ref 3.5–5.2)
Sodium: 140 mmol/L (ref 134–144)
Total Protein: 6.6 g/dL (ref 6.0–8.5)
eGFR: 56 mL/min/{1.73_m2} — ABNORMAL LOW (ref >59)

## 2022-12-17 LAB — CBC WITH DIFFERENTIAL/PLATELET
Basophils Absolute: 0.1 10*3/uL (ref 0.0–0.2)
Basos: 1 %
EOS (ABSOLUTE): 0.5 10*3/uL — ABNORMAL HIGH (ref 0.0–0.4)
Eos: 7 %
Hematocrit: 35.4 % (ref 34.0–46.6)
Hemoglobin: 11.2 g/dL (ref 11.1–15.9)
Immature Grans (Abs): 0 10*3/uL (ref 0.0–0.1)
Immature Granulocytes: 0 %
Lymphocytes Absolute: 1.7 10*3/uL (ref 0.7–3.1)
Lymphs: 24 %
MCH: 27.7 pg (ref 26.6–33.0)
MCHC: 31.6 g/dL (ref 31.5–35.7)
MCV: 88 fL (ref 79–97)
Monocytes Absolute: 0.5 10*3/uL (ref 0.1–0.9)
Monocytes: 7 %
Neutrophils Absolute: 4.1 10*3/uL (ref 1.4–7.0)
Neutrophils: 61 %
Platelets: 212 10*3/uL (ref 150–450)
RBC: 4.04 x10E6/uL (ref 3.77–5.28)
RDW: 15.4 % (ref 11.7–15.4)
WBC: 6.8 10*3/uL (ref 3.4–10.8)

## 2022-12-17 LAB — LIPID PANEL
Chol/HDL Ratio: 3.4 ratio (ref 0.0–4.4)
Cholesterol, Total: 156 mg/dL (ref 100–199)
HDL: 46 mg/dL (ref >39)
LDL Chol Calc (NIH): 73 mg/dL (ref 0–99)
Triglycerides: 224 mg/dL — ABNORMAL HIGH (ref 0–149)
VLDL Cholesterol Cal: 37 mg/dL (ref 5–40)

## 2022-12-17 LAB — HEMOGLOBIN A1C
Est. average glucose Bld gHb Est-mCnc: 123 mg/dL
Hgb A1c MFr Bld: 5.9 % — ABNORMAL HIGH (ref 4.8–5.6)

## 2022-12-26 ENCOUNTER — Encounter: Payer: Self-pay | Admitting: Family Medicine

## 2022-12-29 ENCOUNTER — Other Ambulatory Visit: Payer: Self-pay | Admitting: Family Medicine

## 2022-12-29 DIAGNOSIS — R7303 Prediabetes: Secondary | ICD-10-CM

## 2022-12-29 MED ORDER — METFORMIN HCL 500 MG PO TABS
500.0000 mg | ORAL_TABLET | Freq: Every day | ORAL | 0 refills | Status: DC
Start: 2022-12-29 — End: 2023-03-31

## 2023-01-20 ENCOUNTER — Institutional Professional Consult (permissible substitution): Payer: Medicare PPO | Admitting: Neurology

## 2023-02-20 ENCOUNTER — Other Ambulatory Visit: Payer: Self-pay | Admitting: Family Medicine

## 2023-02-20 DIAGNOSIS — F32A Depression, unspecified: Secondary | ICD-10-CM

## 2023-02-20 DIAGNOSIS — I10 Essential (primary) hypertension: Secondary | ICD-10-CM

## 2023-02-20 DIAGNOSIS — E785 Hyperlipidemia, unspecified: Secondary | ICD-10-CM

## 2023-03-31 ENCOUNTER — Other Ambulatory Visit: Payer: Self-pay | Admitting: Family Medicine

## 2023-03-31 DIAGNOSIS — R7303 Prediabetes: Secondary | ICD-10-CM

## 2023-06-17 ENCOUNTER — Ambulatory Visit: Payer: Medicare PPO | Admitting: Family Medicine

## 2023-09-22 ENCOUNTER — Other Ambulatory Visit: Payer: Self-pay | Admitting: Family Medicine

## 2023-09-22 DIAGNOSIS — K219 Gastro-esophageal reflux disease without esophagitis: Secondary | ICD-10-CM

## 2023-12-11 ENCOUNTER — Other Ambulatory Visit: Payer: Self-pay | Admitting: Family Medicine

## 2023-12-11 DIAGNOSIS — R7303 Prediabetes: Secondary | ICD-10-CM

## 2023-12-12 NOTE — Telephone Encounter (Signed)
 Lvm for patient to call and schedule an appointment for a med refill.

## 2024-07-31 ENCOUNTER — Ambulatory Visit: Admission: EM | Admit: 2024-07-31 | Discharge: 2024-07-31 | Disposition: A | Discharge status: Home / Self Care

## 2024-07-31 DIAGNOSIS — R21 Rash and other nonspecific skin eruption: Secondary | ICD-10-CM | POA: Diagnosis not present

## 2024-07-31 MED ORDER — VALACYCLOVIR HCL 1 G PO TABS: 1000.0000 mg | ORAL_TABLET | Freq: Three times a day (TID) | ORAL | 0 refills | Status: AC

## 2024-07-31 NOTE — ED Triage Notes (Signed)
 Pt c/o painful, itchy rash on back x 1 week. Does not remember what she used

## 2024-07-31 NOTE — Discharge Instructions (Signed)
 I have prescribed valtrex  for shingles. Please follow up with your family medicine doctor if rash persists or worsens.

## 2024-07-31 NOTE — ED Provider Notes (Signed)
 [facility]    CSN: [id number] Arrival date & time: 07/31/24  1553      History   Chief Complaint Chief Complaint  Patient presents with   Rash    HPI [name] is a 79 y.o. female.   Patient presents with painful rash to back for 1 week. She is concerned for shingles given she has had it multiple times. She has applied an anti-itch cream. Denies any changes to the environment that could have caused rash. Denies any fever or known sick contacts.    Rash   Past Medical History:  Diagnosis Date   Abnormal TSH    Anxiety and depression    CKD (chronic kidney disease)    HLD (hyperlipidemia)    Hypertension     Patient Active Problem List   Diagnosis Date Noted   Encounter for administration of vaccine 12/16/2022   Other fatigue 12/16/2022   Prediabetes 12/16/2022   History of vertigo 12/16/2022   Gastroesophageal reflux disease without esophagitis 12/16/2022   Essential hypertension 11/18/2022   Hyperlipidemia 11/18/2022   Anxiety and depression 11/18/2022    Past Surgical History:  Procedure Laterality Date   CHOLECYSTECTOMY     LAPAROSCOPIC TOTAL HYSTERECTOMY     TONSILLECTOMY      OB History   No obstetric history on file.      Home Medications    Prior to Admission medications  Medication Sig Start Date End Date Taking? Authorizing Provider  amLODipine  (NORVASC ) 5 MG tablet Take 1 tablet (5 mg total) by mouth daily. 11/18/22  Yes Booker Darice SAUNDERS, FNP  metoprolol  succinate (TOPROL -XL) 25 MG 24 hr tablet Take 1 tablet (25 mg total) by mouth daily. 11/18/22  Yes Booker Darice SAUNDERS, FNP  pantoprazole  (PROTONIX ) 40 MG tablet Take 1 tablet (40 mg total) by mouth daily. 12/16/22  Yes Booker Darice SAUNDERS, FNP  sertraline  (ZOLOFT ) 50 MG tablet Take 1 tablet (50 mg total) by mouth daily. 11/18/22  Yes Booker Darice SAUNDERS, FNP  valACYclovir  (VALTREX ) 1000 MG tablet Take 1 tablet (1,000 mg total) by mouth 3 (three) times daily for 7 days. 07/31/24 08/07/24 Yes  Hazen Darryle BRAVO, FNP  aspirin EC 81 MG tablet Take 81 mg by mouth.    [provider]  ezetimibe (ZETIA) 10 MG tablet Take 10 mg by mouth daily.    [provider]  metFORMIN  (GLUCOPHAGE ) 500 MG tablet Take 1 tablet (500 mg total) by mouth 2 (two) times daily with a meal. 03/31/23   Booker Darice SAUNDERS, FNP  prednisoLONE acetate (PRED FORTE) 1 % ophthalmic suspension Use as directed on post op instruction sheet from Dr. Nilsa. Patient not taking: Reported on 07/31/2024 09/10/22   [provider]  promethazine-dextromethorphan (PROMETHAZINE-DM) 6.25-15 MG/5ML syrup Take 5 mLs by mouth 4 (four) times daily as needed for cough. Patient not taking: Reported on 11/18/2022 10/13/22   [provider]  rosuvastatin  (CRESTOR ) 5 MG tablet Take 1 tablet (5 mg total) by mouth daily. Patient not taking: Reported on 07/31/2024 11/18/22   Booker Darice SAUNDERS, FNP    Family History Family History  Problem Relation Age of Onset   Lung disease Mother    Mental illness Father     Social History Social History[1]   Allergies   Infliximab, Flavoring agent, Flavoring agent (non-screening), Penicillins, and Statins   Review of Systems Review of Systems Per HPI  Physical Exam Triage Vital Signs ED Triage Vitals  Encounter Vitals Group  BP 07/31/24 1606 125/75     Girls Systolic BP Percentile --      Girls Diastolic BP Percentile --      Boys Systolic BP Percentile --      Boys Diastolic BP Percentile --      Pulse Rate 07/31/24 1606 84     Resp 07/31/24 1606 20     Temp 07/31/24 1606 97.9 F (36.6 C)     Temp Source 07/31/24 1606 Oral     SpO2 07/31/24 1606 95 %     Weight 07/31/24 1607 188 lb 12.8 oz (85.6 kg)     Height --      Head Circumference --      Peak Flow --      Pain Score 07/31/24 1607 2     Pain Loc --      Pain Education --      Exclude from Growth Chart --    No data found.  Updated Vital Signs BP 125/75 (BP Location: Right Arm)   Pulse 84   Temp  97.9 F (36.6 C) (Oral)   Resp 20   Wt 188 lb 12.8 oz (85.6 kg)   SpO2 95%   BMI 36.87 kg/m   Visual Acuity Right Eye Distance:   Left Eye Distance:   Bilateral Distance:    Right Eye Near:   Left Eye Near:    Bilateral Near:     Physical Exam Constitutional:      General: She is not in acute distress.    Appearance: Normal appearance. She is not toxic-appearing or diaphoretic.  HENT:     Head: Normocephalic and atraumatic.  Eyes:     Extraocular Movements: Extraocular movements intact.     Conjunctiva/sclera: Conjunctivae normal.  Pulmonary:     Effort: Pulmonary effort is normal.  Skin:        Comments: Erythematous cluster of lesions that appear scabbed over to right thoracic back.   Neurological:     General: No focal deficit present.     Mental Status: She is alert and oriented to person, place, and time. Mental status is at baseline.  Psychiatric:        Mood and Affect: Mood normal.        Behavior: Behavior normal.        Thought Content: Thought content normal.        Judgment: Judgment normal.      UC Treatments / Results  Labs (all labs ordered are listed, but only abnormal results are displayed) Labs Reviewed - No data to display  EKG   Radiology No results found.  Procedures Procedures (including critical care time)  Medications Ordered in UC Medications - No data to display  Initial Impression / Assessment and Plan / UC Course  I have reviewed the triage vital signs and the nursing notes.  Pertinent labs & imaging results that were available during my care of the patient were reviewed by me and considered in my medical decision making (see chart for details).     Differential diagnoses include herpes zoster versus contact dermatitis. Will treat with valtrex  given it appears to follow a dermatome. Crcl is 59 so no dosage adjustment necessary. Advised strict follow up precautions if symptoms persist or worsen. Patient verbalized  understanding and was agreeable with plan.  Final Clinical Impressions(s) / UC Diagnoses   Final diagnoses:  Rash and nonspecific skin eruption     Discharge Instructions      I  have prescribed valtrex  for shingles. Please follow up with your family medicine doctor if rash persists or worsens.     ED Prescriptions     Medication Sig Dispense Auth. Provider   valACYclovir  (VALTREX ) 1000 MG tablet Take 1 tablet (1,000 mg total) by mouth 3 (three) times daily for 7 days. 21 tablet [place], [place], [place]      PDMP not reviewed this encounter.    [1]  Social History Tobacco Use   Smoking status: Never   Smokeless tobacco: Never  Vaping Use   Vaping status: Never Used  Substance Use Topics   Alcohol use: Not Currently   Drug use: Never     Hazen Darryle BRAVO, [place] 07/31/24 1614  "
